# Patient Record
Sex: Male | Born: 2000 | Marital: Single | Smoking: Current some day smoker
Health system: Southern US, Community
[De-identification: ages and names within clinical notes are randomized; demographics above are authoritative.]

## PROBLEM LIST (undated history)

## (undated) DIAGNOSIS — E119 Type 2 diabetes mellitus without complications: Secondary | ICD-10-CM

---
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
## 2018-11-13 ENCOUNTER — Emergency Department (HOSPITAL_COMMUNITY)
Admission: EM | Admit: 2018-11-13 | Discharge: 2018-11-13 | Disposition: A | Discharge status: Home / Self Care | Payer: Medicaid Other | Attending: Emergency Medicine | Admitting: Emergency Medicine

## 2018-11-13 ENCOUNTER — Encounter (HOSPITAL_COMMUNITY): Payer: Self-pay | Admitting: Emergency Medicine

## 2018-11-13 ENCOUNTER — Emergency Department (HOSPITAL_COMMUNITY): Payer: Medicaid Other

## 2018-11-13 ENCOUNTER — Other Ambulatory Visit: Payer: Self-pay

## 2018-11-13 DIAGNOSIS — E119 Type 2 diabetes mellitus without complications: Secondary | ICD-10-CM | POA: Insufficient documentation

## 2018-11-13 DIAGNOSIS — R05 Cough: Secondary | ICD-10-CM | POA: Diagnosis present

## 2018-11-13 DIAGNOSIS — F17210 Nicotine dependence, cigarettes, uncomplicated: Secondary | ICD-10-CM | POA: Diagnosis not present

## 2018-11-13 DIAGNOSIS — B9789 Other viral agents as the cause of diseases classified elsewhere: Secondary | ICD-10-CM

## 2018-11-13 DIAGNOSIS — J069 Acute upper respiratory infection, unspecified: Secondary | ICD-10-CM

## 2018-11-13 HISTORY — DX: Type 2 diabetes mellitus without complications: E11.9

## 2018-11-13 LAB — GROUP A STREP BY PCR: Group A Strep by PCR: NOT DETECTED

## 2018-11-13 MED ORDER — PROMETHAZINE-DM 6.25-15 MG/5ML PO SYRP: 5.0000 mL | ORAL_SOLUTION | Freq: Four times a day (QID) | ORAL | 0 refills | Status: AC | PRN

## 2018-11-13 MED ORDER — IBUPROFEN 600 MG PO TABS: 600.0000 mg | ORAL_TABLET | Freq: Four times a day (QID) | ORAL | 0 refills | Status: AC

## 2018-11-13 MED ORDER — PREDNISONE 20 MG PO TABS
40.0000 mg | ORAL_TABLET | Freq: Once | ORAL | Status: AC
  Administered 2018-11-13: 40 mg via ORAL
  Filled 2018-11-13: qty 2

## 2018-11-13 MED ORDER — IBUPROFEN 800 MG PO TABS
800.0000 mg | ORAL_TABLET | Freq: Once | ORAL | Status: AC
  Administered 2018-11-13: 800 mg via ORAL
  Filled 2018-11-13: qty 1

## 2018-11-13 MED ORDER — OXYMETAZOLINE HCL 0.05 % NA SOLN
2.0000 | Freq: Once | NASAL | Status: AC
  Administered 2018-11-13: 2 via NASAL
  Filled 2018-11-13: qty 30

## 2018-11-13 MED ORDER — IBUPROFEN 600 MG PO TABS: 600.0000 mg | ORAL_TABLET | Freq: Four times a day (QID) | ORAL | 0 refills | Status: DC

## 2018-11-13 MED ORDER — PREDNISONE 20 MG PO TABS: 40.0000 mg | ORAL_TABLET | Freq: Every day | ORAL | 0 refills | Status: AC

## 2018-11-13 NOTE — Discharge Instructions (Signed)
Your strep test is negative.  Your chest x-ray is negative for acute changes.  Please increase fluids.  Please use Tylenol every 4 hours or ibuprofen every 6 hours for fever, and/or aching.  Use prednisone daily with a meal.  Use Afrin every 8 hours for 5 days only.  Use Promethazine DM for cough. Please wash hands frequently.  Please wipe off surfaces such as doorknobs and countertops frequently.  Please use your mask until symptoms have resolved.  Please practice social distancing, keeping yourself 6 feet from others.  Please self quarantine for the next 10 days as recommended by the CDC.  Please see your primary physician or return to the emergency department if your symptoms should worsen, you develop shortness of breath, fever that would not respond to Tylenol or ibuprofen, changes in your condition, problems, or concerns.

## 2018-11-13 NOTE — ED Provider Notes (Signed)
[facility]Y [facility]PARTMENT Provider Note   CSN: [id number] Arrival date & time: 11/13/18  1929    History   Chief Complaint Chief Complaint  Patient presents with  . Cough    HPI Ryan Robertson is a 18 y.o. male.     The history is provided by the patient.  URI  Presenting symptoms: congestion and cough   Presenting symptoms: no fever   Severity:  Moderate Onset quality:  Gradual Duration:  2 days Timing:  Intermittent Progression:  Unchanged Chronicity:  New Relieved by:  Nothing Worsened by:  Nothing Ineffective treatments:  None tried Associated symptoms: headaches, myalgias and sinus pain   Associated symptoms: no arthralgias, no neck pain and no wheezing   Risk factors: no recent illness, no recent travel and no sick contacts     Past Medical History:  Diagnosis Date  . Diabetes mellitus without complication (HCC)     There are no active problems to display for this patient.   History reviewed. No pertinent surgical history.      Home Medications    Prior to Admission medications   Not on File    Family History No family history on file.  Social History Social History   Tobacco Use  . Smoking status: Current Every Day Smoker    Packs/day: 0.50    Types: Cigarettes  . Smokeless tobacco: Never Used  Substance Use Topics  . Alcohol use: Never    Frequency: Never  . Drug use: Never     Allergies   Patient has no allergy information on record.   Review of Systems Review of Systems  Constitutional: Positive for chills. Negative for activity change and fever.       All ROS Neg except as noted in HPI  HENT: Positive for congestion and sinus pain. Negative for nosebleeds.   Eyes: Negative for photophobia and discharge.  Respiratory: Positive for cough. Negative for shortness of breath and wheezing.   Cardiovascular: Negative for chest pain and palpitations.  Gastrointestinal: Negative for abdominal pain and blood in stool.   Genitourinary: Negative for dysuria, frequency and hematuria.  Musculoskeletal: Positive for myalgias. Negative for arthralgias, back pain and neck pain.  Skin: Negative.   Neurological: Positive for headaches. Negative for dizziness, seizures and speech difficulty.  Psychiatric/Behavioral: Negative for confusion and hallucinations.     Physical Exam Updated Vital Signs BP 106/60 (BP Location: Left Arm)   Pulse 99   Temp 97.9 F (36.6 C) (Oral)   Resp 17   SpO2 97%   Physical Exam Vitals signs and nursing note reviewed.  Constitutional:      Appearance: He is well-developed. He is not toxic-appearing.  HENT:     Head: Normocephalic.     Right Ear: Tympanic membrane and external ear normal.     Left Ear: Tympanic membrane and external ear normal.     Nose: Congestion present.  Eyes:     General: Lids are normal.     Pupils: Pupils are equal, round, and reactive to light.  Neck:     Musculoskeletal: Normal range of motion and neck supple.     Vascular: No carotid bruit.  Cardiovascular:     Rate and Rhythm: Normal rate and regular rhythm.     Pulses: Normal pulses.     Heart sounds: Normal heart sounds.  Pulmonary:     Effort: No respiratory distress.     Breath sounds: Normal breath sounds.  Abdominal:     General:  Bowel sounds are normal.     Palpations: Abdomen is soft.     Tenderness: There is no abdominal tenderness. There is no guarding.  Musculoskeletal: Normal range of motion.  Lymphadenopathy:     Head:     Right side of head: No submandibular adenopathy.     Left side of head: No submandibular adenopathy.     Cervical: No cervical adenopathy.  Skin:    General: Skin is warm and dry.  Neurological:     Mental Status: He is alert and oriented to person, place, and time.     Cranial Nerves: No cranial nerve deficit.     Sensory: No sensory deficit.  Psychiatric:        Speech: Speech normal.      ED Treatments / Results  Labs (all labs ordered are  listed, but only abnormal results are displayed) Labs Reviewed  GROUP A STREP BY PCR    EKG None  Radiology Dg Chest Portable 1 View  Result Date: 11/13/2018 CLINICAL DATA:  Productive cough EXAM: PORTABLE CHEST 1 VIEW COMPARISON:  None. FINDINGS: Heart and mediastinal contours are within normal limits. No focal opacities or effusions. No acute bony abnormality. IMPRESSION: No active disease. Electronically Signed   By: Charlett Nose M.D.   On: 11/13/2018 20:50    Procedures Procedures (including critical care time)  Medications Ordered in ED Medications - No data to display   Initial Impression / Assessment and Plan / ED Course  I have reviewed the triage vital signs and the nursing notes.  Pertinent labs & imaging results that were available during my care of the patient were reviewed by me and considered in my medical decision making (see chart for details).          Final Clinical Impressions(s) / ED Diagnoses mdm  Vital signs reviewed.  Pulse oximetry is within normal limits by my interpretation.  The examination suggest an upper respiratory infection with cough.  I have asked the patient to use prednisone daily, Promethazine DM for cough, ibuprofen for fever, and/or aching.  I provided the patient with a mask to use.  I have asked him to self quarantine over the next 10 days.  Patient acknowledges understanding of the instructions.   Final diagnoses:  Viral URI with cough    ED Discharge Orders         Ordered    predniSONE (DELTASONE) 20 MG tablet  Daily     11/13/18 2214    promethazine-dextromethorphan (PROMETHAZINE-DM) 6.25-15 MG/5ML syrup  4 times daily PRN     11/13/18 2214    ibuprofen (ADVIL) 600 MG tablet  4 times daily,   Status:  Discontinued     11/13/18 2214    ibuprofen (ADVIL) 600 MG tablet  4 times daily     11/13/18 2215           Ivery Quale, PA-C 11/14/18 2331    Samuel Jester, DO 11/18/18 1851

## 2018-11-13 NOTE — ED Triage Notes (Signed)
Pt C/O cough and sore throat that started 2 days ago. Pt denies fevers and denies being around anyone sick.

## 2019-03-10 ENCOUNTER — Emergency Department (HOSPITAL_COMMUNITY): Payer: Medicaid Other

## 2019-03-10 ENCOUNTER — Emergency Department (HOSPITAL_COMMUNITY)
Admission: EM | Admit: 2019-03-10 | Discharge: 2019-03-10 | Disposition: A | Discharge status: Home / Self Care | Payer: Medicaid Other | Attending: Emergency Medicine | Admitting: Emergency Medicine

## 2019-03-10 ENCOUNTER — Other Ambulatory Visit: Payer: Self-pay

## 2019-03-10 ENCOUNTER — Encounter (HOSPITAL_COMMUNITY): Payer: Self-pay | Admitting: Emergency Medicine

## 2019-03-10 DIAGNOSIS — E1165 Type 2 diabetes mellitus with hyperglycemia: Secondary | ICD-10-CM

## 2019-03-10 DIAGNOSIS — E871 Hypo-osmolality and hyponatremia: Secondary | ICD-10-CM | POA: Diagnosis not present

## 2019-03-10 DIAGNOSIS — Z79899 Other long term (current) drug therapy: Secondary | ICD-10-CM | POA: Diagnosis not present

## 2019-03-10 DIAGNOSIS — N451 Epididymitis: Secondary | ICD-10-CM | POA: Insufficient documentation

## 2019-03-10 DIAGNOSIS — N50812 Left testicular pain: Secondary | ICD-10-CM | POA: Diagnosis present

## 2019-03-10 DIAGNOSIS — F17210 Nicotine dependence, cigarettes, uncomplicated: Secondary | ICD-10-CM | POA: Diagnosis not present

## 2019-03-10 LAB — CBC WITH DIFFERENTIAL/PLATELET
Abs Immature Granulocytes: 0.06 10*3/uL (ref 0.00–0.07)
Basophils Absolute: 0 10*3/uL (ref 0.0–0.1)
Basophils Relative: 0 %
Eosinophils Absolute: 0.1 10*3/uL (ref 0.0–0.5)
Eosinophils Relative: 1 %
HCT: 48.3 % (ref 39.0–52.0)
Hemoglobin: 15.9 g/dL (ref 13.0–17.0)
Immature Granulocytes: 1 %
Lymphocytes Relative: 19 %
Lymphs Abs: 2.1 10*3/uL (ref 0.7–4.0)
MCH: 28.5 pg (ref 26.0–34.0)
MCHC: 32.9 g/dL (ref 30.0–36.0)
MCV: 86.6 fL (ref 80.0–100.0)
Monocytes Absolute: 1.1 10*3/uL — ABNORMAL HIGH (ref 0.1–1.0)
Monocytes Relative: 10 %
Neutro Abs: 8.1 10*3/uL — ABNORMAL HIGH (ref 1.7–7.7)
Neutrophils Relative %: 69 %
Platelets: 201 10*3/uL (ref 150–400)
RBC: 5.58 MIL/uL (ref 4.22–5.81)
RDW: 11.9 % (ref 11.5–15.5)
WBC: 11.5 10*3/uL — ABNORMAL HIGH (ref 4.0–10.5)
nRBC: 0 % (ref 0.0–0.2)

## 2019-03-10 LAB — URINALYSIS, ROUTINE W REFLEX MICROSCOPIC
Bilirubin Urine: NEGATIVE
Glucose, UA: >=500 mg/dL — AB
Hgb urine dipstick: NEGATIVE
Ketones, ur: 80 mg/dL — AB
Leukocytes,Ua: NEGATIVE
Nitrite: NEGATIVE
Protein, ur: NEGATIVE mg/dL
Specific Gravity, Urine: 1.038 — ABNORMAL HIGH (ref 1.005–1.030)
pH: 6 (ref 5.0–8.0)

## 2019-03-10 LAB — BASIC METABOLIC PANEL
Anion gap: 12 (ref 5–15)
BUN: 12 mg/dL (ref 6–20)
CO2: 22 mmol/L (ref 22–32)
Calcium: 8.7 mg/dL — ABNORMAL LOW (ref 8.9–10.3)
Chloride: 95 mmol/L — ABNORMAL LOW (ref 98–111)
Creatinine, Ser: 0.71 mg/dL (ref 0.61–1.24)
GFR calc Af Amer: >60 mL/min (ref >60)
GFR calc non Af Amer: >60 mL/min (ref >60)
Glucose, Bld: 324 mg/dL — ABNORMAL HIGH (ref 70–99)
Potassium: 4 mmol/L (ref 3.5–5.1)
Sodium: 129 mmol/L — ABNORMAL LOW (ref 135–145)

## 2019-03-10 LAB — LACTIC ACID, PLASMA: Lactic Acid, Venous: 1.4 mmol/L (ref 0.5–1.9)

## 2019-03-10 MED ORDER — DOXYCYCLINE HYCLATE 100 MG PO CAPS: 100.0000 mg | ORAL_CAPSULE | Freq: Two times a day (BID) | ORAL | 0 refills | Status: AC

## 2019-03-10 MED ORDER — CEFTRIAXONE SODIUM 250 MG IJ SOLR
250.0000 mg | Freq: Once | INTRAMUSCULAR | Status: AC
  Administered 2019-03-10: 18:00:00 250 mg via INTRAMUSCULAR
  Filled 2019-03-10: qty 250

## 2019-03-10 NOTE — ED Triage Notes (Signed)
Pt with left ?possible abscess for past two days. Pt states he was hot last night but was not able to check for fever.  Low grade fever noted in triage.

## 2019-03-10 NOTE — Discharge Instructions (Addendum)
You were seen in the emergency department today for pain to your left testicle.  An ultrasound was obtained which did show an infection to your epididymitis.  These take antibiotics as prescribed.  It is important that you take the entire course.  Your glucose level was also elevated today and you are spilling glucose into your urine.  It appears you have been noncompliant with diabetic medication for the past 2 years.  It is very important that you get back on this medication as you could have detrimental effects with uncontrolled diabetes.  Please follow-up with the above-mentioned clinics for primary care evaluation.

## 2019-03-10 NOTE — ED Notes (Signed)
Labs drawn  To [place]

## 2019-03-10 NOTE — ED Notes (Signed)
Lab draw

## 2019-03-10 NOTE — ED Notes (Signed)
Call to [place]  Call to lab  PA eval

## 2019-03-10 NOTE — ED Notes (Signed)
Labs drawn  To [place] 

## 2019-03-10 NOTE — ED Notes (Signed)
Pt reports 2 day hx of L testicle pain   L testicle has large hard painful and hot to touch area

## 2019-03-10 NOTE — ED Provider Notes (Signed)
[facility]   CSN: [id number] Arrival date & time: 03/10/19  1516    History   Chief Complaint Chief Complaint  Patient presents with  . Testicle Pain    HPI Ryan Robertson is a 18 y.o. male who presents to the ED today complaining of gradual onset, constant, sharp, left testicular pain and swelling x2 days.  Patient also complains of subjective fever yesterday.  He has not been taking anything for his symptoms.  He said the pain became so unbearable today that he came to the ED for further evaluation.  Patient states he has not had intercourse in over a year.  Denies chills, nausea, vomiting, abdominal pain, penile discharge, dysuria.        Past Medical History:  Diagnosis Date  . Diabetes mellitus without complication (HCC)     There are no active problems to display for this patient.   History reviewed. No pertinent surgical history.      Home Medications    Prior to Admission medications   Medication Sig Start Date End Date Taking? Authorizing Provider  doxycycline (VIBRAMYCIN) 100 MG capsule Take 1 capsule (100 mg total) by mouth 2 (two) times daily. 03/10/19   Hyman HopesVenter, Novalynn Branaman, PA-C  ibuprofen (ADVIL) 600 MG tablet Take 1 tablet (600 mg total) by mouth 4 (four) times daily. 11/13/18   Ivery QualeBryant, Hobson, PA-C  predniSONE (DELTASONE) 20 MG tablet Take 2 tablets (40 mg total) by mouth daily. 11/13/18   Ivery QualeBryant, Hobson, PA-C  promethazine-dextromethorphan (PROMETHAZINE-DM) 6.25-15 MG/5ML syrup Take 5 mLs by mouth 4 (four) times daily as needed. 11/13/18   Ivery QualeBryant, Hobson, PA-C    Family History No family history on file.  Social History Social History   Tobacco Use  . Smoking status: Current Some Day Smoker    Packs/day: 0.50    Types: Cigarettes  . Smokeless tobacco: Never Used  Substance Use Topics  . Alcohol use: Never    Frequency: Never  . Drug use: Never     Allergies   Patient has no allergy information on record.    Review of Systems Review of Systems  Constitutional: Positive for fever (subjective). Negative for chills.  HENT: Negative for congestion.   Eyes: Negative for visual disturbance.  Respiratory: Negative for cough.   Cardiovascular: Negative for chest pain.  Gastrointestinal: Negative for abdominal pain, nausea and vomiting.  Genitourinary: Positive for scrotal swelling and testicular pain. Negative for discharge, dysuria, hematuria and penile swelling.  Musculoskeletal: Negative for arthralgias.  Skin: Negative for rash.  Neurological: Negative for headaches.     Physical Exam Updated Vital Signs BP 131/82 (BP Location: Right Arm)   Pulse (!) 122   Temp 99.4 F (37.4 C) (Oral)   Resp 18   Ht 5\' 7"  (1.702 m)   Wt 112.7 kg   SpO2 100%   BMI 38.92 kg/m   Physical Exam Vitals signs and nursing note reviewed.  Constitutional:      Appearance: He is not ill-appearing.  HENT:     Head: Normocephalic and atraumatic.  Eyes:     Conjunctiva/sclera: Conjunctivae normal.  Neck:     Musculoskeletal: Neck supple.  Cardiovascular:     Rate and Rhythm: Normal rate and regular rhythm.  Pulmonary:     Effort: Pulmonary effort is normal.     Breath sounds: Normal breath sounds.  Abdominal:     Palpations: Abdomen is soft.     Tenderness: There is no abdominal tenderness.  Genitourinary:  Comments: Chaperone present for exam, Armandina Gemma.  Circumcised penis without phimosis/paraphimosis, hypospadias, erythema, tenderness, or discharge. No rashes or lesions.   Left testicle with obvious swelling/mass that extends into the inguinal canal.  Exquisitely tender to palpation.  Area feels warm to the touch.  Normal cremasteric reflex.  No abnormal lie.     Skin:    General: Skin is warm and dry.  Neurological:     Mental Status: He is alert.      ED Treatments / Results  Labs (all labs ordered are listed, but only abnormal results are displayed) Labs Reviewed  BASIC  METABOLIC PANEL - Abnormal; Notable for the following components:      Result Value   Sodium 129 (*)    Chloride 95 (*)    Glucose, Bld 324 (*)    Calcium 8.7 (*)    All other components within normal limits  CBC WITH DIFFERENTIAL/PLATELET - Abnormal; Notable for the following components:   WBC 11.5 (*)    Neutro Abs 8.1 (*)    Monocytes Absolute 1.1 (*)    All other components within normal limits  URINALYSIS, ROUTINE W REFLEX MICROSCOPIC - Abnormal; Notable for the following components:   Specific Gravity, Urine 1.038 (*)    Glucose, UA >=500 (*)    Ketones, ur 80 (*)    Bacteria, UA FEW (*)    All other components within normal limits  LACTIC ACID, PLASMA    EKG None  Radiology US Scrotum W/doppler  Result Date: 03/10/2019 CLINICAL DATA:  LEFT scrotal swelling and pain.  No prior surgery. EXAM: SCROTAL ULTRASOUND DOPPLER ULTRASOUND OF THE TESTICLES TECHNIQUE: Complete ultrasound examination of the testicles, epididymis, and other scrotal structures was performed. Color and spectral Doppler ultrasound were also utilized to evaluate blood flow to the testicles. COMPARISON:  None. FINDINGS: Right testicle Measurements: 5.4 x 2.3 x 3.3 centimeters. No mass or microlithiasis visualized. Left testicle Measurements: 4.9 x 2.8 x 2.6 centimeter. No mass or microlithiasis visualized. Right epididymis:  Normal in size and appearance. Left epididymis: LEFT epididymis is prominent and hypervascular on Doppler evaluation. Hydrocele:  None visualized. Varicocele:  None visualized. Pulsed Doppler interrogation of both testes demonstrates normal low resistance arterial and venous waveforms bilaterally. IMPRESSION: No evidence for testicular torsion or mass. Enlarged, hypervascular LEFT epididymis consistent with epididymitis. Electronically Signed   By: Nolon Nations M.D.   On: 03/10/2019 16:25    Procedures Procedures (including critical care time)  Medications Ordered in ED Medications   cefTRIAXone (ROCEPHIN) injection 250 mg (has no administration in time range)     Initial Impression / Assessment and Plan / ED Course  I have reviewed the triage vital signs and the nursing notes.  Pertinent labs & imaging results that were available during my care of the patient were reviewed by me and considered in my medical decision making (see chart for details).  Clinical Course as of Mar 09 1742  [facility] Mar 10, 2019  1629 Question elevation due to pain vs infection. Awaiting ultrasound results.   WBC(!): 11.5 [MV]  71 Consistent with epididymitis   US SCROTUM W/DOPPLER [MV]    Clinical Course User Index [MV] Eustaquio Maize, PA-C   18 year old male who presents to the ED with left testicular pain for the past 2 days.  Arrival to the ED he is a temp of 99.4 and is tachycardic to 122.  Endorses subjective fevers yesterday.  No history of hernia in the past.  Patient has  obvious mass to the left testicle that extends into the inguinal canal.  No overlying skin changes.  Area does feel hard to the touch.  Patient is not sexually active.  No penile discharge or dysuria.  Concern for epididymitis versus torsion versus incarcerated hernia.  Will obtain testicular ultrasound today as well as blood work.  If ultrasound is equivocal will proceed to CT pelvis.   Ultrasound positive for epididymitis.  Will prescribe antibiotic for this and give Rocephin in the ED.  White blood cell count elevated 11.5.  Lactic acid 1.4.  Consistent with infection.  Awaiting UA.  Sodium mildly decreased at 129 although with sodium correction 133.  Glucose elevated at 324.  Patient does not have a history of diabetes.  He reports he has been off of his diabetic medication for the past 2 years as he does not like medication.  He reports he had diarrhea with metformin so he stopped taking it.  also reports he was on insulin at some point in time but stopped taking that as well.  Had lengthy discussion with patient  regarding need for medication to prevent further complications.  He does not currently have a PCP.  Will give him 2 clinics in the area for him to follow-up with.  Creatinine level within normal limits.  No anion gap.  Patient is in agreement with plan at this time and stable for discharge home.       Final Clinical Impressions(s) / ED Diagnoses   Final diagnoses:  Epididymitis  Type 2 diabetes mellitus with hyperglycemia, without long-term current use of insulin (HCC)  Hyponatremia    ED Discharge Orders         Ordered    doxycycline (VIBRAMYCIN) 100 MG capsule  2 times daily     03/10/19 1739           Tanda RockersVenter, Sherice Ijames, PA-C 03/10/19 2110    Mancel BaleWentz, Elliott, MD 03/11/19 1229

## 2019-03-14 ENCOUNTER — Encounter (HOSPITAL_COMMUNITY): Payer: Self-pay | Admitting: Emergency Medicine

## 2019-03-14 ENCOUNTER — Other Ambulatory Visit: Payer: Self-pay

## 2019-03-14 ENCOUNTER — Emergency Department (HOSPITAL_COMMUNITY)
Admission: EM | Admit: 2019-03-14 | Discharge: 2019-03-14 | Disposition: A | Discharge status: Home / Self Care | Payer: Medicaid Other | Attending: Emergency Medicine | Admitting: Emergency Medicine

## 2019-03-14 DIAGNOSIS — Z5321 Procedure and treatment not carried out due to patient leaving prior to being seen by health care provider: Secondary | ICD-10-CM | POA: Insufficient documentation

## 2019-03-14 DIAGNOSIS — R1030 Lower abdominal pain, unspecified: Secondary | ICD-10-CM | POA: Diagnosis present

## 2019-03-14 NOTE — ED Triage Notes (Signed)
Pt seen here 4 days ago and discharged with diagnosis of Epididymitis and sent home on antibiotics. Pt has not yet completed course of antibiotics and states the pain is much worse.

## 2019-05-11 IMAGING — CR PORTABLE CHEST - 1 VIEW
1 series · 1 of 1 positions shown · non-contrast
Comparison: None.

CLINICAL DATA: Productive cough

EXAM:
PORTABLE CHEST 1 VIEW

[portable]
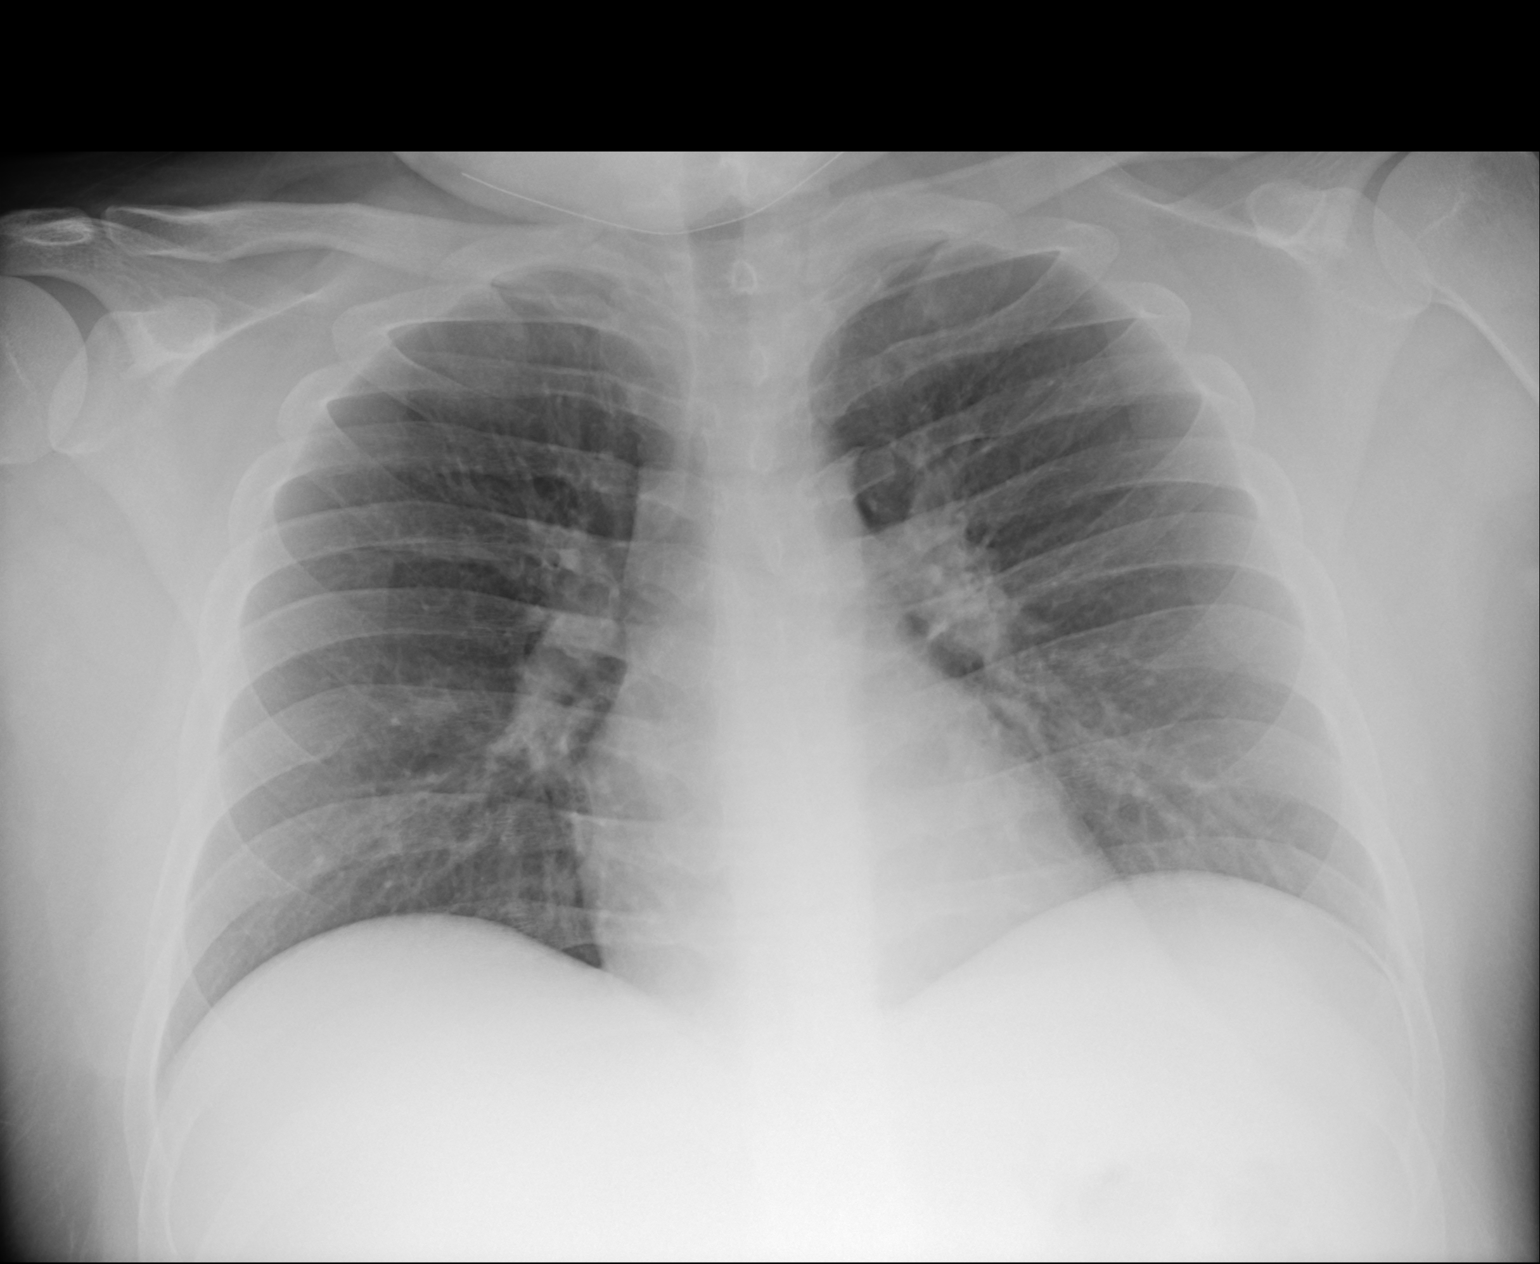

[1 of 1 positions shown; findings below may reference images not displayed]

FINDINGS: Heart and mediastinal contours are within normal limits. No focal
opacities or effusions. No acute bony abnormality.
IMPRESSION: No active disease.

## 2020-02-17 ENCOUNTER — Emergency Department (HOSPITAL_COMMUNITY): Payer: Medicaid Other

## 2020-02-17 ENCOUNTER — Encounter (HOSPITAL_COMMUNITY): Payer: Self-pay | Admitting: Emergency Medicine

## 2020-02-17 ENCOUNTER — Other Ambulatory Visit: Payer: Self-pay

## 2020-02-17 ENCOUNTER — Emergency Department (HOSPITAL_COMMUNITY)
Admission: EM | Admit: 2020-02-17 | Discharge: 2020-02-17 | Disposition: A | Discharge status: Home / Self Care | Payer: Medicaid Other | Attending: Emergency Medicine | Admitting: Emergency Medicine

## 2020-02-17 DIAGNOSIS — F17210 Nicotine dependence, cigarettes, uncomplicated: Secondary | ICD-10-CM | POA: Diagnosis not present

## 2020-02-17 DIAGNOSIS — R5383 Other fatigue: Secondary | ICD-10-CM | POA: Diagnosis not present

## 2020-02-17 DIAGNOSIS — J189 Pneumonia, unspecified organism: Secondary | ICD-10-CM

## 2020-02-17 DIAGNOSIS — E119 Type 2 diabetes mellitus without complications: Secondary | ICD-10-CM | POA: Insufficient documentation

## 2020-02-17 DIAGNOSIS — U071 COVID-19: Secondary | ICD-10-CM | POA: Insufficient documentation

## 2020-02-17 DIAGNOSIS — J029 Acute pharyngitis, unspecified: Secondary | ICD-10-CM | POA: Diagnosis present

## 2020-02-17 LAB — CBC
HCT: 48.2 % (ref 39.0–52.0)
Hemoglobin: 16 g/dL (ref 13.0–17.0)
MCH: 28.7 pg (ref 26.0–34.0)
MCHC: 33.2 g/dL (ref 30.0–36.0)
MCV: 86.5 fL (ref 80.0–100.0)
Platelets: 123 10*3/uL — ABNORMAL LOW (ref 150–400)
RBC: 5.57 MIL/uL (ref 4.22–5.81)
RDW: 11.9 % (ref 11.5–15.5)
WBC: 4.8 10*3/uL (ref 4.0–10.5)
nRBC: 0 % (ref 0.0–0.2)

## 2020-02-17 LAB — COMPREHENSIVE METABOLIC PANEL
ALT: 30 U/L (ref 0–44)
AST: 25 U/L (ref 15–41)
Albumin: 3.8 g/dL (ref 3.5–5.0)
Alkaline Phosphatase: 54 U/L (ref 38–126)
Anion gap: 13 (ref 5–15)
BUN: 7 mg/dL (ref 6–20)
CO2: 22 mmol/L (ref 22–32)
Calcium: 8.1 mg/dL — ABNORMAL LOW (ref 8.9–10.3)
Chloride: 99 mmol/L (ref 98–111)
Creatinine, Ser: 0.78 mg/dL (ref 0.61–1.24)
GFR calc Af Amer: >60 mL/min (ref >60)
GFR calc non Af Amer: >60 mL/min (ref >60)
Glucose, Bld: 205 mg/dL — ABNORMAL HIGH (ref 70–99)
Potassium: 3.8 mmol/L (ref 3.5–5.1)
Sodium: 134 mmol/L — ABNORMAL LOW (ref 135–145)
Total Bilirubin: 0.8 mg/dL (ref 0.3–1.2)
Total Protein: 7.1 g/dL (ref 6.5–8.1)

## 2020-02-17 LAB — TROPONIN I (HIGH SENSITIVITY): Troponin I (High Sensitivity): <2 ng/L (ref <18)

## 2020-02-17 LAB — LIPASE, BLOOD: Lipase: 25 U/L (ref 11–51)

## 2020-02-17 LAB — POC SARS CORONAVIRUS 2 AG -  ED: SARS Coronavirus 2 Ag: NEGATIVE

## 2020-02-17 MED ORDER — AZITHROMYCIN 250 MG PO TABS
250.0000 mg | ORAL_TABLET | Freq: Every day | ORAL | 0 refills | Status: AC
Start: 2020-02-17 — End: ?

## 2020-02-17 MED ORDER — SODIUM CHLORIDE 0.9% FLUSH: 3.0000 mL | Freq: Once | INTRAVENOUS | Status: DC

## 2020-02-17 NOTE — ED Triage Notes (Signed)
Pt c/o chest pain, abd pain, body aches and sore throat x 2 weeks.

## 2020-02-17 NOTE — Discharge Instructions (Addendum)
Your chest x-ray suggest that you have a pneumonia.  It is unclear as to whether or not this is bacterial or viral.  However, I would like for you to take the prescribed antibiotics, as directed.  You should receive the results of your COVID-19 testing in the next couple of hours.  If it is positive, it is imperative that you maintain isolation precautions.    You will need to schedule an appointment with a primary care provider as soon as possible.  You have an elevated blood glucose here in the ED and given your history of diet-controlled diabetes, it is very important that you continue to monitor your blood sugars.  I would recommend that you have fasting labs obtained.  Please take Tylenol or ibuprofen as needed for fever control.  I recommend over-the-counter medications such as Mucinex or Delsym as needed for symptoms of cough.  Your chest discomfort is likely costochondritis, please read the attachment for better understanding.  Return to the ED or seek immediate medical attention should experience any new or worsening symptoms.

## 2020-02-17 NOTE — ED Provider Notes (Signed)
[facility] EMERGENCY DEPARTMENT Provider Note   CSN: [id number] Arrival date & time: 02/17/20  1845     History Chief Complaint  Patient presents with  . Chest Pain    Ryan Robertson is a 19 y.o. male with PMH of DM presents to the ED with a 2-week history of intermittent fevers and chills, cough, chest discomfort worse with coughing and deep inspiration, and body aches.  He also endorses mildly sore throat, fatigue, and "just feeling awful".  He states that he did not get the COVID-19 vaccine because he does not believe in it.  He states that his symptoms first occurred after he was exposed to his sister-in-law who is also battling upper respiratory infection symptoms.  He states that several family members have had similar symptoms.  His diabetes is diet-controlled and he states that he does not take any medications.  He also endorses some right upper quadrant abdominal discomfort x4 months, but nothing that is new or different.  He denies any exertional chest pain, history of exertional syncope, history of clots or clotting disorder, recent extremity swelling or edema, new or different abdominal pain, nausea or vomiting, urinary symptoms, or changes in bowel habits.  Patient speaks English well and there is no interpreter used.  HPI     Past Medical History:  Diagnosis Date  . Diabetes mellitus without complication (HCC)     There are no problems to display for this patient.   History reviewed. No pertinent surgical history.     No family history on file.  Social History   Tobacco Use  . Smoking status: Current Some Day Smoker    Packs/day: 0.50    Types: Cigarettes  . Smokeless tobacco: Never Used  Substance Use Topics  . Alcohol use: Never  . Drug use: Never    Home Medications Prior to Admission medications   Medication Sig Start Date End Date Taking? Authorizing Provider  azithromycin (ZITHROMAX) 250 MG tablet Take 1 tablet (250 mg total) by mouth daily. Take  first 2 tablets together, then 1 every day until finished. 02/17/20   Lorelee New, PA-C  doxycycline (VIBRAMYCIN) 100 MG capsule Take 1 capsule (100 mg total) by mouth 2 (two) times daily. 03/10/19   Hyman Hopes, Margaux, PA-C  ibuprofen (ADVIL) 600 MG tablet Take 1 tablet (600 mg total) by mouth 4 (four) times daily. 11/13/18   Ivery Quale, PA-C  predniSONE (DELTASONE) 20 MG tablet Take 2 tablets (40 mg total) by mouth daily. 11/13/18   Ivery Quale, PA-C  promethazine-dextromethorphan (PROMETHAZINE-DM) 6.25-15 MG/5ML syrup Take 5 mLs by mouth 4 (four) times daily as needed. 11/13/18   Ivery Quale, PA-C    Allergies    Patient has no known allergies.  Review of Systems   Review of Systems  All other systems reviewed and are negative.   Physical Exam Updated Vital Signs BP (!) 126/98 (BP Location: Right Arm)   Pulse (!) 109   Temp 98.4 F (36.9 C) (Oral)   Resp 16   Ht 5\' 6"  (1.676 m)   Wt (!) 113.4 kg   SpO2 99%   BMI 40.35 kg/m   Physical Exam Vitals and nursing note reviewed. Exam conducted with a chaperone present.  Constitutional:      General: He is not in acute distress.    Appearance: He is ill-appearing.  HENT:     Head: Normocephalic and atraumatic.     Mouth/Throat:     Pharynx: Oropharynx is clear.  Eyes:  General: No scleral icterus.    Conjunctiva/sclera: Conjunctivae normal.  Cardiovascular:     Rate and Rhythm: Normal rate and regular rhythm.     Pulses: Normal pulses.     Heart sounds: Normal heart sounds.  Pulmonary:     Comments: No respiratory distress.  Breath sounds intact bilaterally.  No increased work of breathing. Abdominal:     Comments: Mild tenderness appreciated in right upper quadrant, no tenderness elsewhere.  No guarding.  Negative Murphy sign.  Abdomen is soft, nondistended.  Musculoskeletal:     Cervical back: Normal range of motion. No rigidity.     Right lower leg: No edema.     Left lower leg: No edema.  Skin:     General: Skin is dry.     Capillary Refill: Capillary refill takes less than 2 seconds.  Neurological:     Mental Status: He is alert and oriented to person, place, and time.     GCS: GCS eye subscore is 4. GCS verbal subscore is 5. GCS motor subscore is 6.  Psychiatric:        Mood and Affect: Mood normal.        Behavior: Behavior normal.        Thought Content: Thought content normal.     ED Results / Procedures / Treatments   Labs (all labs ordered are listed, but only abnormal results are displayed) Labs Reviewed  CBC - Abnormal; Notable for the following components:      Result Value   Platelets 123 (*)    All other components within normal limits  COMPREHENSIVE METABOLIC PANEL - Abnormal; Notable for the following components:   Sodium 134 (*)    Glucose, Bld 205 (*)    Calcium 8.1 (*)    All other components within normal limits  SARS CORONAVIRUS 2 BY RT PCR (HOSPITAL ORDER, PERFORMED IN [facility])  LIPASE, BLOOD  POC SARS CORONAVIRUS 2 AG -  ED  TROPONIN I (HIGH SENSITIVITY)    EKG EKG Interpretation  Date/Time:  Tuesday February 17 2020 19:27:15 EDT Ventricular Rate:  107 PR Interval:  142 QRS Duration: 82 QT Interval:  320 QTC Calculation: 427 R Axis:   79 Text Interpretation: Sinus tachycardia Otherwise normal ECG No old tracing to compare Confirmed by Meridee Score [phone]) on 02/17/2020 8:12:48 PM   Radiology DG Chest 2 View  Result Date: 02/17/2020 CLINICAL DATA:  Chest pain EXAM: CHEST - 2 VIEW COMPARISON:  None. FINDINGS: The heart size and mediastinal contours are within normal limits. Rounded nodular airspace opacities are seen throughout both lungs. No pleural effusion. No acute osseous abnormality. IMPRESSION: Multifocal nodular airspace opacities are seen throughout both lungs which could be due to multifocal pneumonia. Electronically Signed   By: Jonna Clark M.D.   On: 02/17/2020 19:58    Procedures Procedures (including critical  care time)  Medications Ordered in ED Medications  sodium chloride flush (NS) 0.9 % injection 3 mL (3 mLs Intravenous Not Given 02/17/20 1953)    ED Course  I have reviewed the triage vital signs and the nursing notes.  Pertinent labs & imaging results that were available during my care of the patient were reviewed by me and considered in my medical decision making (see chart for details).    MDM Rules/Calculators/A&P                          Patient presents to the ED  for 2-week history of upper respiratory infection symptoms, likely viral etiology.  Patient has not had the COVID-19 vaccine and states that he has had numerous family encounters with individuals who have shared similar symptoms, suggesting communicable disease.  He does not believe in the vaccine and is adamant about not receiving it despite my education here in the ED.  Chest x-ray is personally reviewed and demonstrates evidence suggestive of a multifocal pneumonia.  His point-of-care COVID-19 testing was negative and I feel as though it is reasonable to treat with azithromycin to cover for bacterial etiology.    Patient also was hyperglycemic at 205.  I encouraged him to get established with a primary care provider soon as possible so that he may have his blood sugars monitored and further blood work obtained.  He states that he has a history of diabetes, however he has been diet controlled and has not been on medications for nearly 10 years.  The remainder of his laboratory work-up is largely unremarkable.  His physical exam is also benign.  His vital signs are stable and WNL on my examination, despite mild tachycardia while in triage.  Do not feel as though further work-up or imaging is warranted.  All of the evaluation and work-up results were discussed with the patient and any family at bedside.  Patient and/or family were informed that while patient is appropriate for discharge at this time, some medical emergencies may  only develop or become detectable after a period of time.  I specifically instructed patient and/or family to return to return to the ED or seek immediate medical attention for any new or worsening symptoms.  They were provided opportunity to ask any additional questions and have none at this time.  Prior to discharge patient is feeling well, agreeable with plan for discharge home.  They have expressed understanding of verbal discharge instructions as well as return precautions and are agreeable to the plan.   Ryan Robertson was evaluated in Emergency Department on 02/17/2020 for the symptoms described in the history of present illness. He was evaluated in the context of the global COVID-19 pandemic, which necessitated consideration that the patient might be at risk for infection with the SARS-CoV-2 virus that causes COVID-19. Institutional protocols and algorithms that pertain to the evaluation of patients at risk for COVID-19 are in a state of rapid change based on information released by regulatory bodies including the CDC and federal and state organizations. These policies and algorithms were followed during the patient's care in the ED.   Final Clinical Impression(s) / ED Diagnoses Final diagnoses:  Community acquired pneumonia, unspecified laterality    Rx / DC Orders ED Discharge Orders         Ordered    azithromycin (ZITHROMAX) 250 MG tablet  Daily     Discontinue  Reprint     02/17/20 2320           Lorelee New, PA-C 02/17/20 2333    Terrilee Files, MD 02/18/20 1214

## 2020-02-18 LAB — SARS CORONAVIRUS 2 BY RT PCR (HOSPITAL ORDER, PERFORMED IN ~~LOC~~ HOSPITAL LAB): SARS Coronavirus 2: POSITIVE — AB

## 2020-08-14 IMAGING — DX DG CHEST 2V
2 series · 2 of 2 positions shown · non-contrast
Comparison: None.

CLINICAL DATA: Chest pain

EXAM:
CHEST - 2 VIEW

[chest pa]
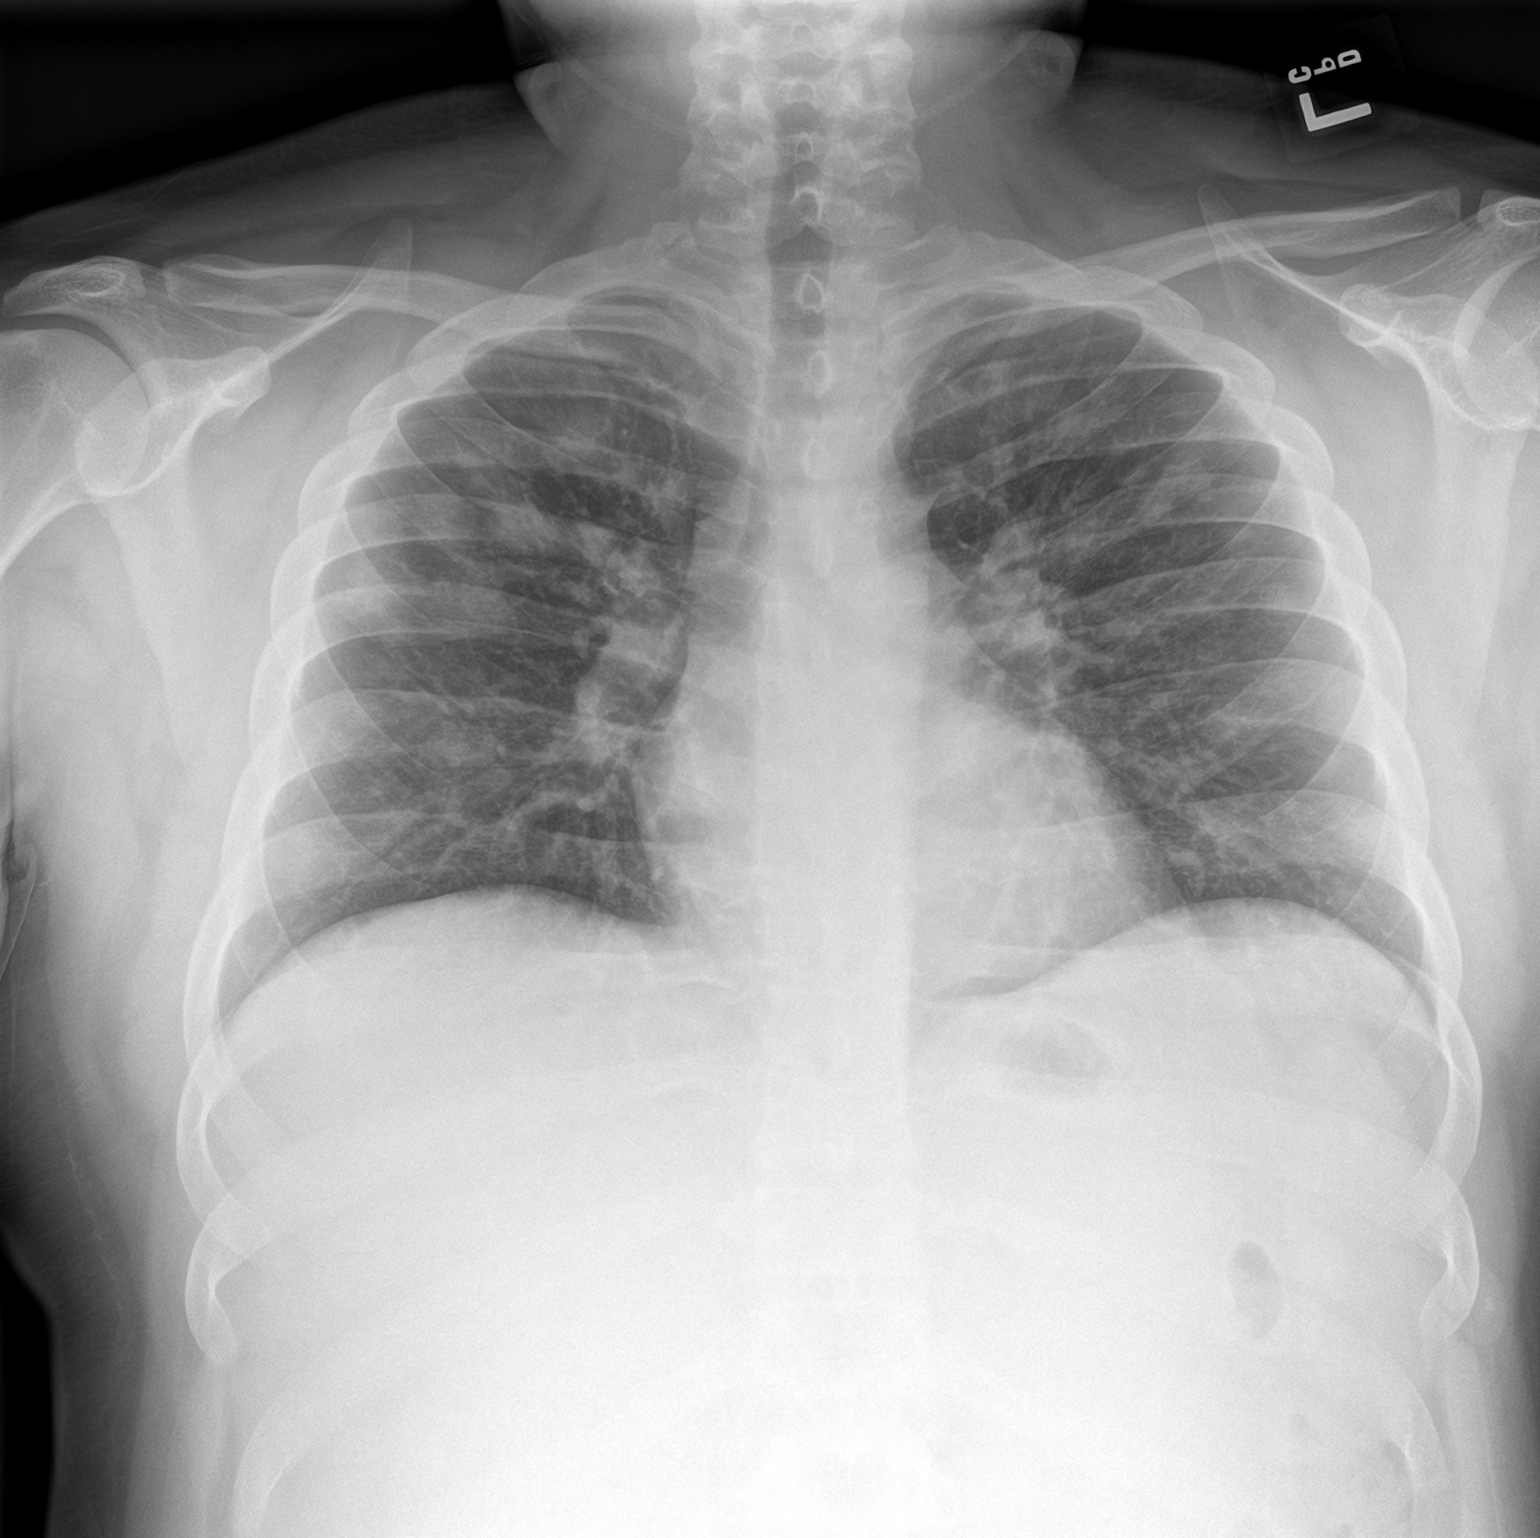

[chest lat]
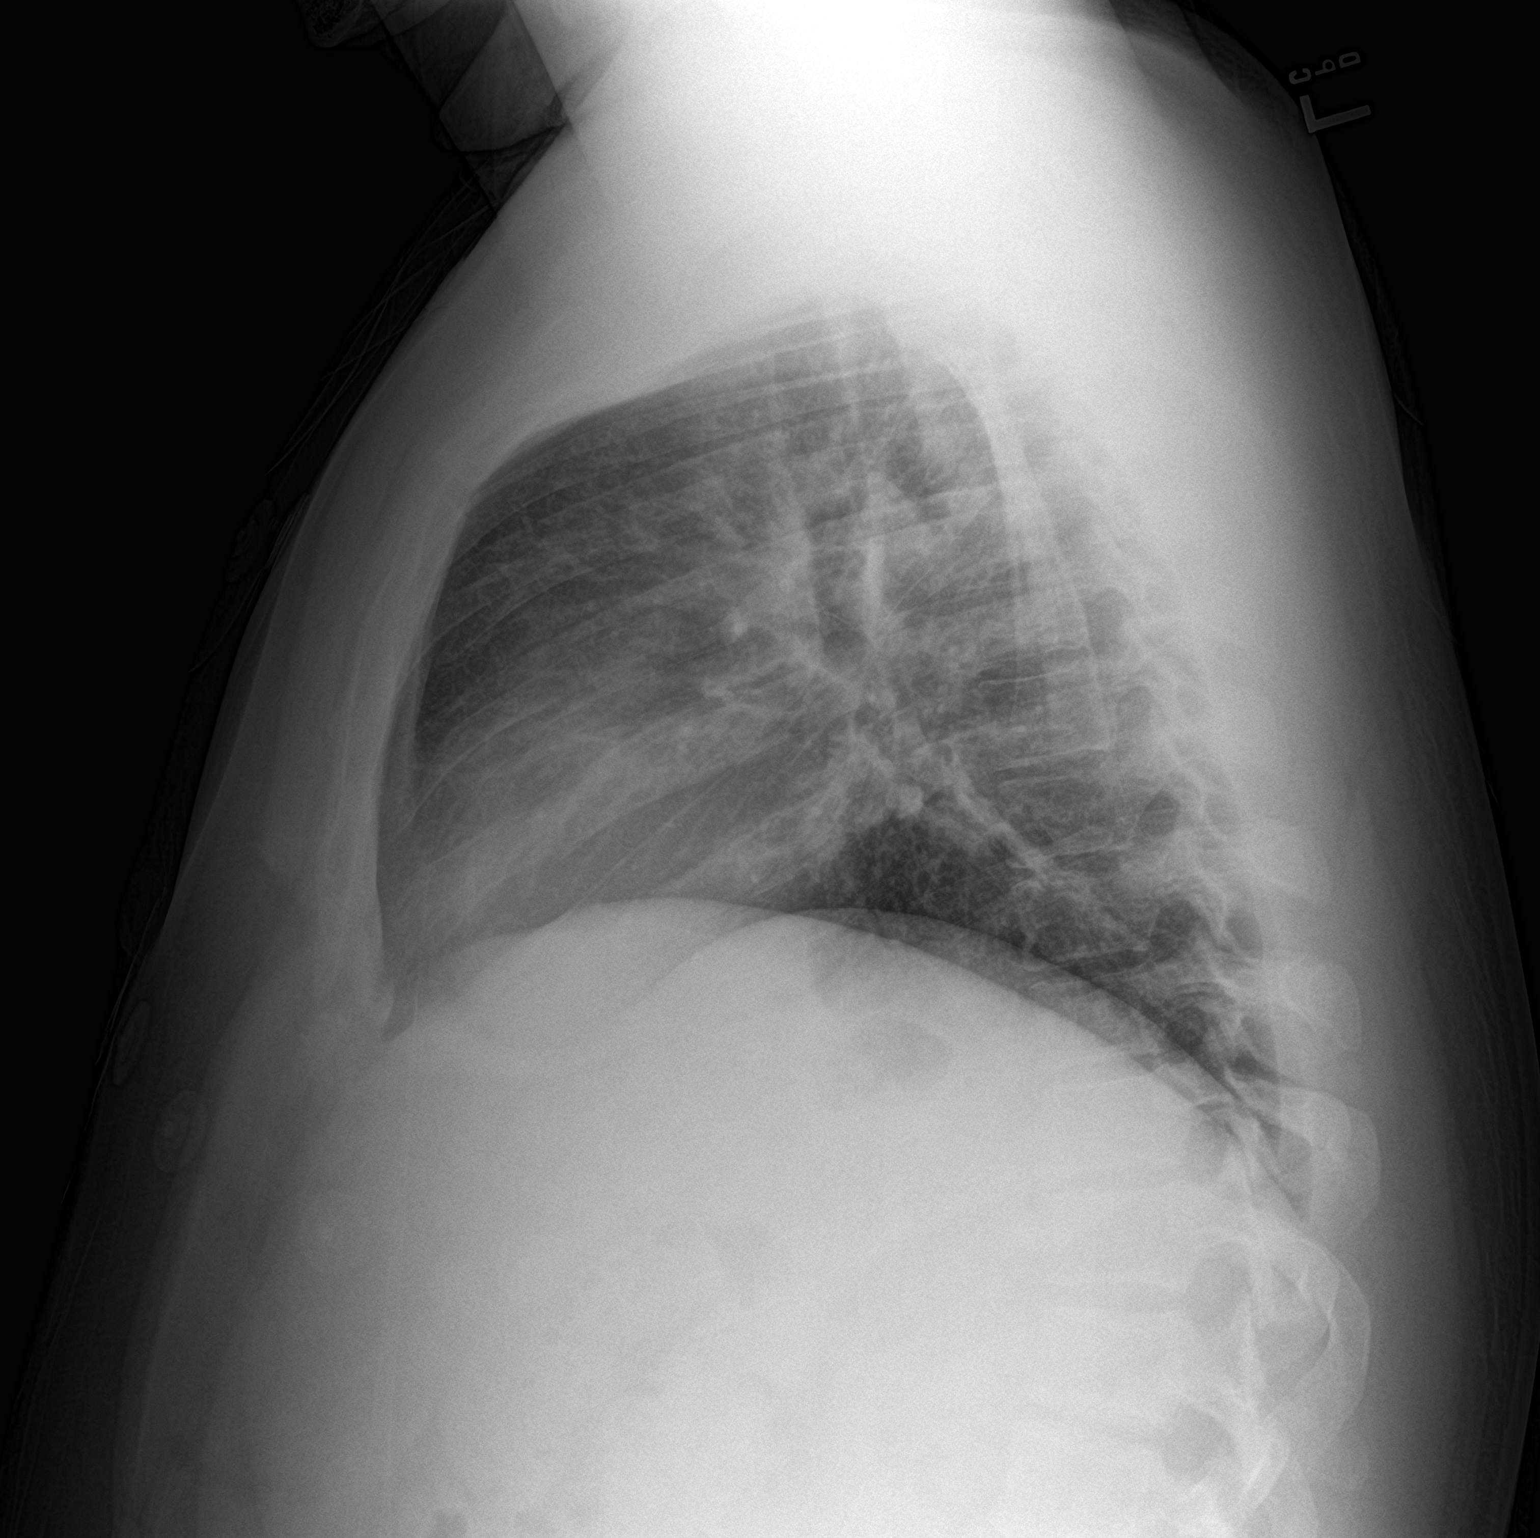

[2 of 2 positions shown; findings below may reference images not displayed]

FINDINGS: The heart size and mediastinal contours are within normal limits.
Rounded nodular airspace opacities are seen throughout both lungs.
No pleural effusion. No acute osseous abnormality.
IMPRESSION: Multifocal nodular airspace opacities are seen throughout both lungs
which could be due to multifocal pneumonia.
